# Patient Record
Sex: Female | Born: 1950 | Race: White | Hispanic: No | Marital: Married | Smoking: Former smoker
Health system: Southern US, Community
[De-identification: ages and names within clinical notes are randomized; demographics above are authoritative.]

## PROBLEM LIST (undated history)

## (undated) DIAGNOSIS — I4891 Unspecified atrial fibrillation: Secondary | ICD-10-CM

## (undated) DIAGNOSIS — C799 Secondary malignant neoplasm of unspecified site: Secondary | ICD-10-CM

## (undated) DIAGNOSIS — E039 Hypothyroidism, unspecified: Secondary | ICD-10-CM

## (undated) DIAGNOSIS — Z87891 Personal history of nicotine dependence: Secondary | ICD-10-CM

## (undated) DIAGNOSIS — C3490 Malignant neoplasm of unspecified part of unspecified bronchus or lung: Secondary | ICD-10-CM

## (undated) HISTORY — PX: BRONCHOSCOPY: SUR163

---

## 2003-10-16 ENCOUNTER — Other Ambulatory Visit: Payer: Self-pay

## 2004-11-26 ENCOUNTER — Inpatient Hospital Stay: Payer: Self-pay | Admitting: Psychiatry

## 2004-12-12 ENCOUNTER — Ambulatory Visit: Payer: Self-pay | Admitting: Family Medicine

## 2004-12-17 ENCOUNTER — Ambulatory Visit: Payer: Self-pay | Admitting: Family Medicine

## 2004-12-17 ENCOUNTER — Other Ambulatory Visit: Admission: RE | Admit: 2004-12-17 | Discharge: 2004-12-17 | Payer: Self-pay | Admitting: Family Medicine

## 2005-03-07 ENCOUNTER — Ambulatory Visit: Payer: Self-pay | Admitting: Gastroenterology

## 2005-05-23 ENCOUNTER — Ambulatory Visit: Payer: Self-pay | Admitting: Gastroenterology

## 2007-11-16 ENCOUNTER — Ambulatory Visit: Payer: Self-pay | Admitting: Family Medicine

## 2009-07-31 ENCOUNTER — Ambulatory Visit: Payer: Self-pay | Admitting: Endocrinology

## 2009-11-14 ENCOUNTER — Inpatient Hospital Stay: Payer: Self-pay | Admitting: Endocrinology

## 2011-01-20 IMAGING — CT CT HEAD WITHOUT CONTRAST
2 series · 16 of 30 positions shown, 20 images · non-contrast
Comparison: none

REASON FOR EXAM: pre-syncopal episode
COMMENTS:

[Series 2: without · axial · non-contrast · 0.42mm/px · z∈[+337,+452]mm · 13 of 35 slices shown, 17 images]
[im 3/35  brain]
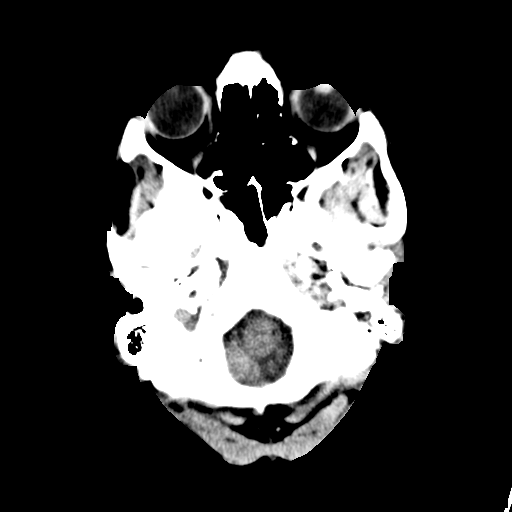
[im 3/35  bone]
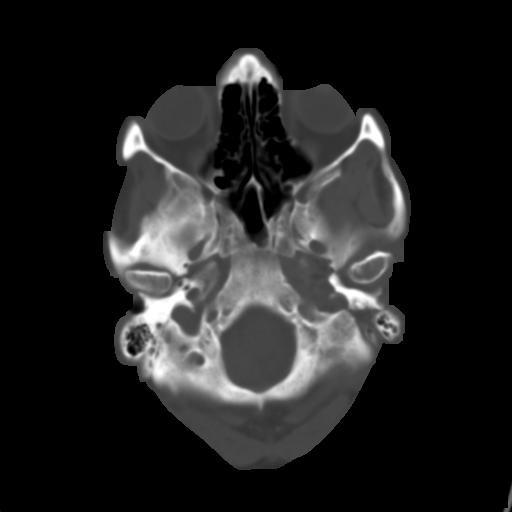
[im 5/35  brain]
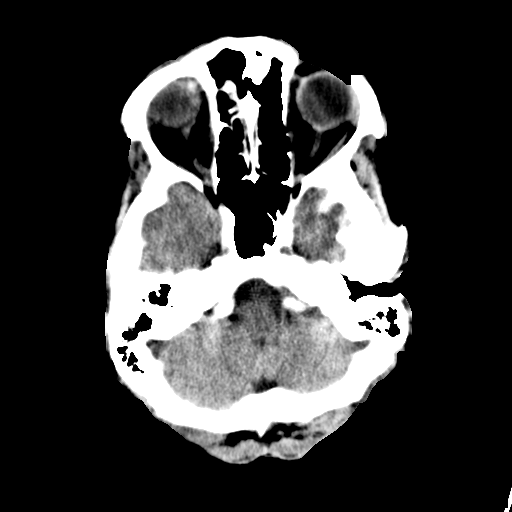
[im 8/35  brain]
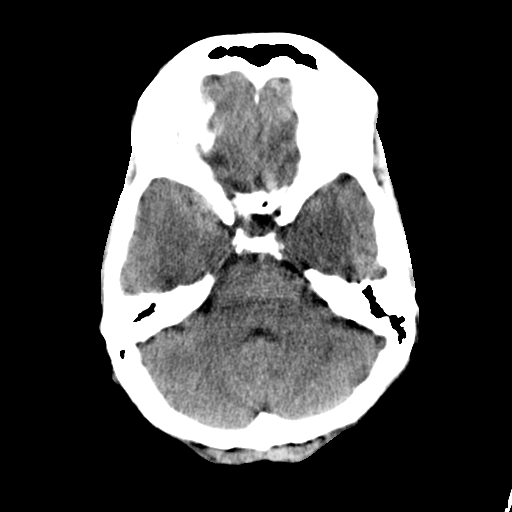
[im 10/35  brain]
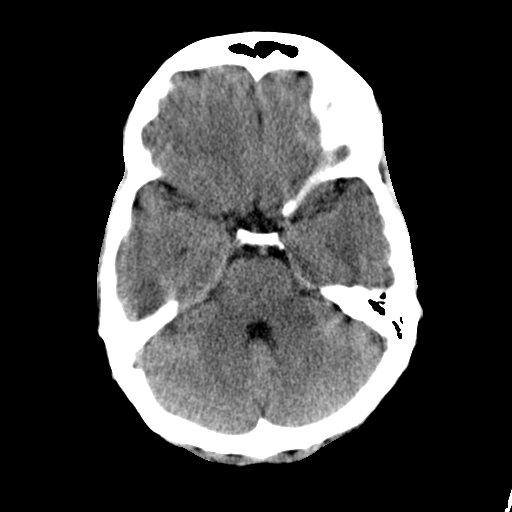
[im 13/35  brain]
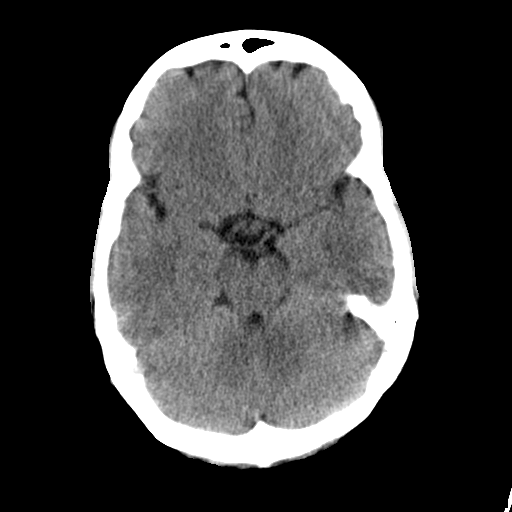
[im 13/35  bone]
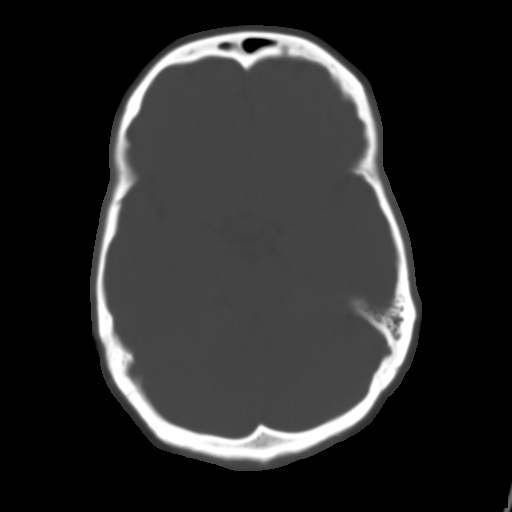
[im 15/35  brain]
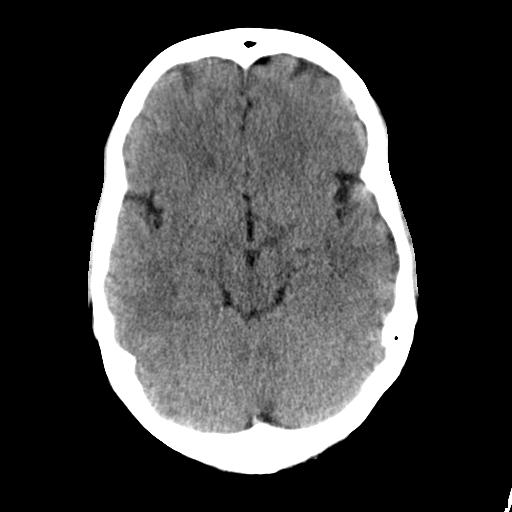
[im 18/35  brain]
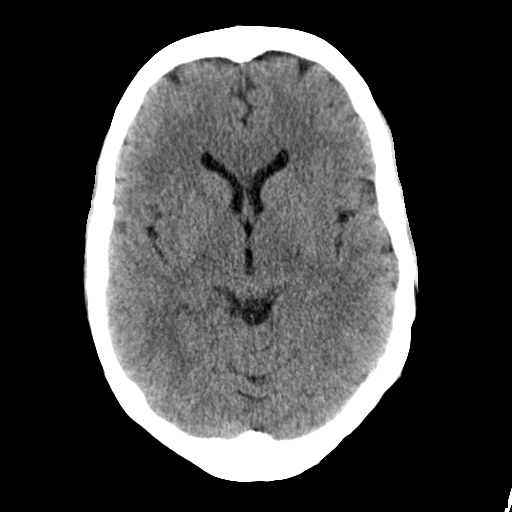
[im 20/35  brain]
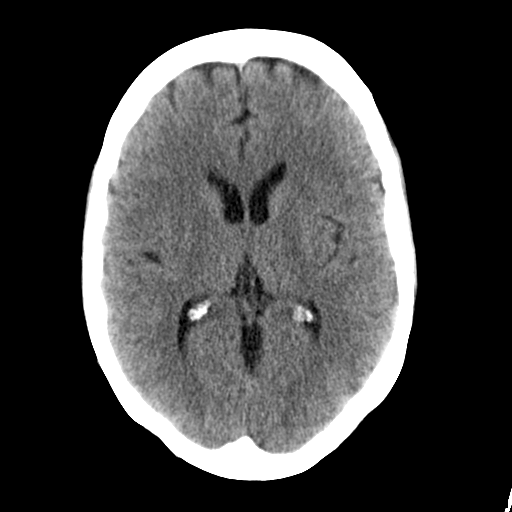
[im 22/35  brain]
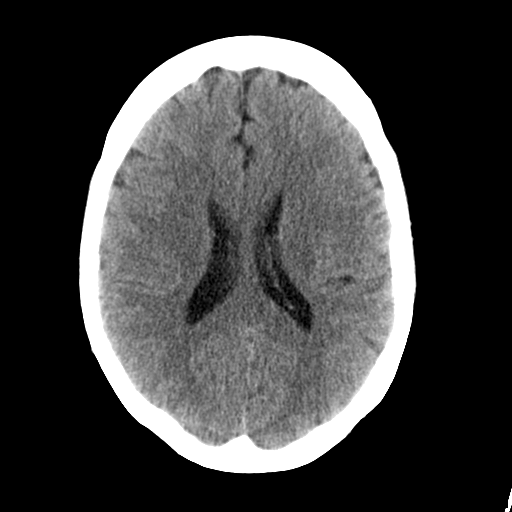
[im 22/35  bone]
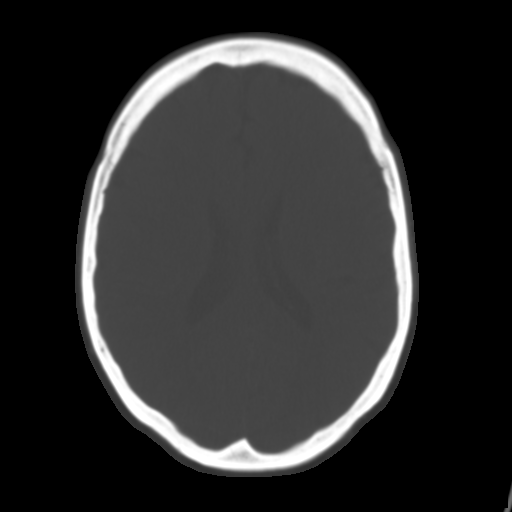
[im 25/35  brain]
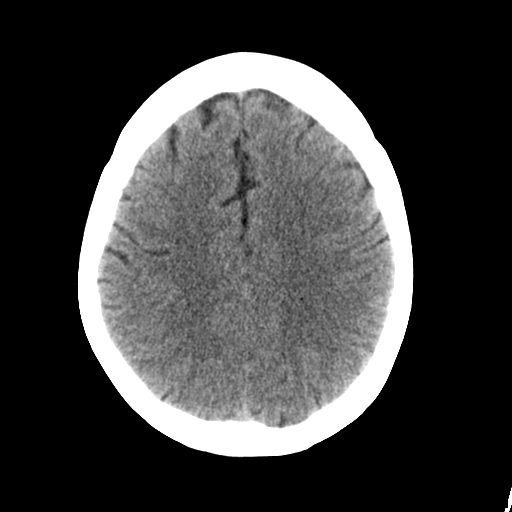
[im 27/35  brain]
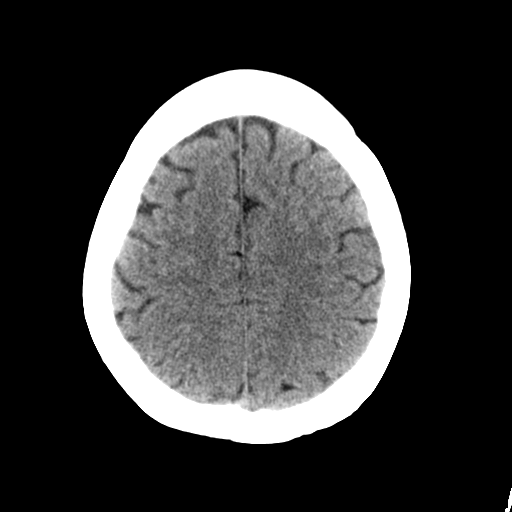
[im 30/35  brain]
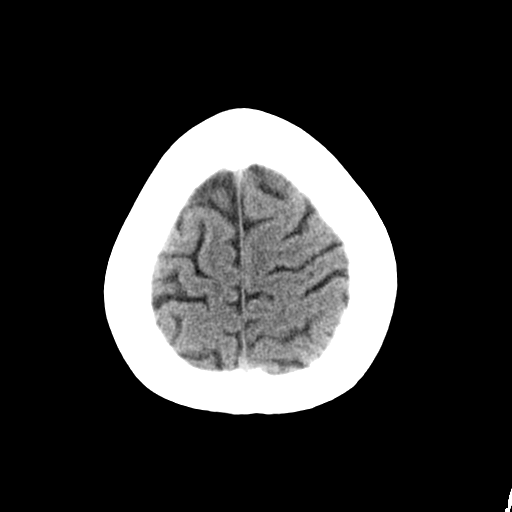
[im 32/35  brain]
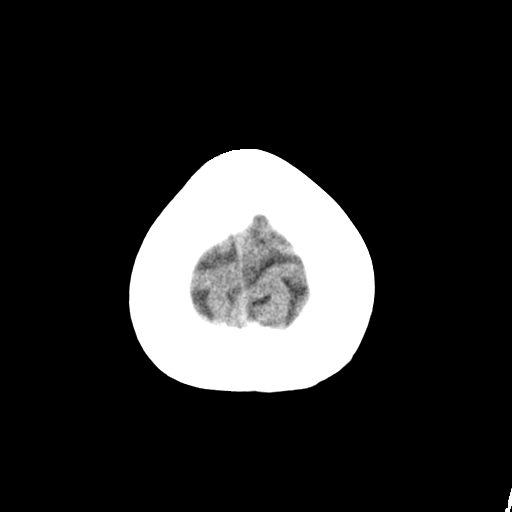
[im 32/35  bone]
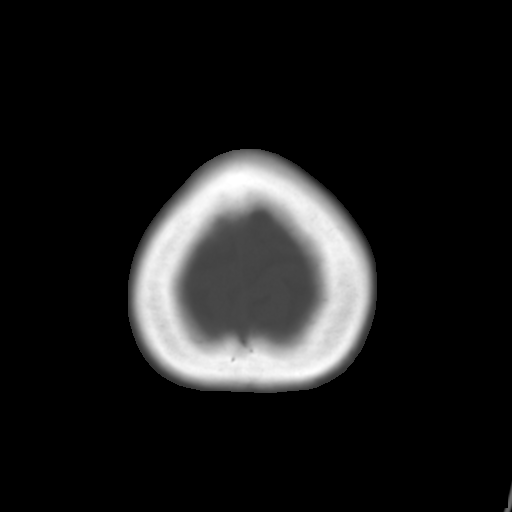

[Series 3: bone · axial · 0.42mm/px · z∈[+337,+367]mm · 3 of 35 slices shown]
[im 3/35  bone]
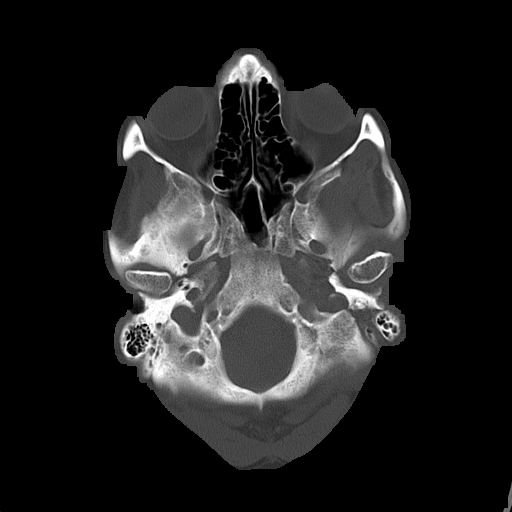
[im 8/35  bone]
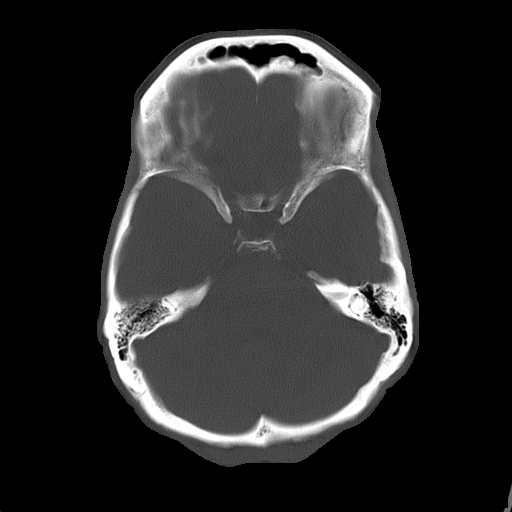
[im 13/35  bone]
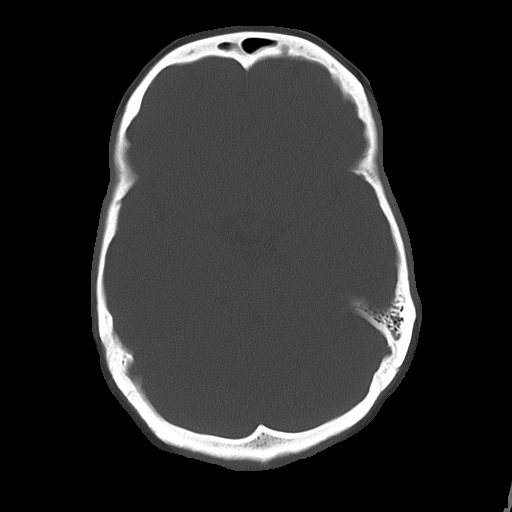

[16 of 30 positions shown; findings below may reference images not displayed]

PROCEDURE:     CT  - CT HEAD WITHOUT CONTRAST  - November 14, 2009  [DATE]

RESULT:     Noncontrast emergent CT of the brain is performed in the
standard fashion. The patient has no previous examinations for comparison.

There is a density in the left frontal sinus consistent with an osteoma
measuring approximately 8 to 8.5 mm in diameter. The sinuses are otherwise
clear. The ventricles and sulci are normal. The calvarium is intact. There
is no intracranial hemorrhage, mass, mass effect or midline shift.
IMPRESSION: 1.     No acute intracranial abnormality.
2.     Left frontal sinus osteoma.

## 2018-12-23 ENCOUNTER — Other Ambulatory Visit: Payer: Self-pay

## 2018-12-23 DIAGNOSIS — Z20822 Contact with and (suspected) exposure to covid-19: Secondary | ICD-10-CM

## 2018-12-25 LAB — NOVEL CORONAVIRUS, NAA: SARS-CoV-2, NAA: NOT DETECTED

## 2021-08-26 ENCOUNTER — Inpatient Hospital Stay
Admission: EM | Admit: 2021-08-26 | Discharge: 2021-09-07 | DRG: 951 | Disposition: E | Payer: Medicare Other | Attending: Internal Medicine | Admitting: Internal Medicine

## 2021-08-26 ENCOUNTER — Other Ambulatory Visit: Payer: Self-pay

## 2021-08-26 ENCOUNTER — Emergency Department: Payer: Medicare Other

## 2021-08-26 ENCOUNTER — Encounter: Payer: Self-pay | Admitting: Internal Medicine

## 2021-08-26 DIAGNOSIS — C7889 Secondary malignant neoplasm of other digestive organs: Secondary | ICD-10-CM | POA: Diagnosis present

## 2021-08-26 DIAGNOSIS — S72002A Fracture of unspecified part of neck of left femur, initial encounter for closed fracture: Secondary | ICD-10-CM | POA: Diagnosis present

## 2021-08-26 DIAGNOSIS — I480 Paroxysmal atrial fibrillation: Secondary | ICD-10-CM | POA: Diagnosis present

## 2021-08-26 DIAGNOSIS — R64 Cachexia: Secondary | ICD-10-CM | POA: Diagnosis present

## 2021-08-26 DIAGNOSIS — T7431XA Adult psychological abuse, confirmed, initial encounter: Secondary | ICD-10-CM | POA: Diagnosis present

## 2021-08-26 DIAGNOSIS — G893 Neoplasm related pain (acute) (chronic): Secondary | ICD-10-CM | POA: Diagnosis present

## 2021-08-26 DIAGNOSIS — C7931 Secondary malignant neoplasm of brain: Secondary | ICD-10-CM | POA: Diagnosis present

## 2021-08-26 DIAGNOSIS — Z681 Body mass index (BMI) 19 or less, adult: Secondary | ICD-10-CM

## 2021-08-26 DIAGNOSIS — F319 Bipolar disorder, unspecified: Secondary | ICD-10-CM | POA: Diagnosis present

## 2021-08-26 DIAGNOSIS — T7411XA Adult physical abuse, confirmed, initial encounter: Secondary | ICD-10-CM | POA: Diagnosis present

## 2021-08-26 DIAGNOSIS — M797 Fibromyalgia: Secondary | ICD-10-CM | POA: Diagnosis present

## 2021-08-26 DIAGNOSIS — Z66 Do not resuscitate: Secondary | ICD-10-CM | POA: Diagnosis present

## 2021-08-26 DIAGNOSIS — E785 Hyperlipidemia, unspecified: Secondary | ICD-10-CM | POA: Diagnosis present

## 2021-08-26 DIAGNOSIS — E43 Unspecified severe protein-calorie malnutrition: Secondary | ICD-10-CM | POA: Diagnosis present

## 2021-08-26 DIAGNOSIS — Z515 Encounter for palliative care: Secondary | ICD-10-CM | POA: Diagnosis not present

## 2021-08-26 DIAGNOSIS — F419 Anxiety disorder, unspecified: Secondary | ICD-10-CM | POA: Diagnosis present

## 2021-08-26 DIAGNOSIS — C3490 Malignant neoplasm of unspecified part of unspecified bronchus or lung: Secondary | ICD-10-CM | POA: Diagnosis present

## 2021-08-26 DIAGNOSIS — R531 Weakness: Secondary | ICD-10-CM

## 2021-08-26 DIAGNOSIS — Z9221 Personal history of antineoplastic chemotherapy: Secondary | ICD-10-CM

## 2021-08-26 DIAGNOSIS — R627 Adult failure to thrive: Secondary | ICD-10-CM | POA: Diagnosis present

## 2021-08-26 DIAGNOSIS — Z20822 Contact with and (suspected) exposure to covid-19: Secondary | ICD-10-CM | POA: Diagnosis present

## 2021-08-26 DIAGNOSIS — R54 Age-related physical debility: Secondary | ICD-10-CM | POA: Diagnosis present

## 2021-08-26 DIAGNOSIS — J189 Pneumonia, unspecified organism: Secondary | ICD-10-CM | POA: Diagnosis present

## 2021-08-26 DIAGNOSIS — R6520 Severe sepsis without septic shock: Secondary | ICD-10-CM | POA: Diagnosis present

## 2021-08-26 DIAGNOSIS — W19XXXA Unspecified fall, initial encounter: Secondary | ICD-10-CM | POA: Diagnosis present

## 2021-08-26 DIAGNOSIS — E039 Hypothyroidism, unspecified: Secondary | ICD-10-CM | POA: Diagnosis present

## 2021-08-26 DIAGNOSIS — F418 Other specified anxiety disorders: Secondary | ICD-10-CM | POA: Diagnosis present

## 2021-08-26 DIAGNOSIS — R0902 Hypoxemia: Secondary | ICD-10-CM | POA: Diagnosis present

## 2021-08-26 DIAGNOSIS — A419 Sepsis, unspecified organism: Secondary | ICD-10-CM | POA: Diagnosis present

## 2021-08-26 DIAGNOSIS — R9431 Abnormal electrocardiogram [ECG] [EKG]: Secondary | ICD-10-CM

## 2021-08-26 DIAGNOSIS — T7491XA Unspecified adult maltreatment, confirmed, initial encounter: Secondary | ICD-10-CM | POA: Diagnosis present

## 2021-08-26 DIAGNOSIS — Z803 Family history of malignant neoplasm of breast: Secondary | ICD-10-CM

## 2021-08-26 DIAGNOSIS — E876 Hypokalemia: Secondary | ICD-10-CM | POA: Diagnosis present

## 2021-08-26 DIAGNOSIS — Z87891 Personal history of nicotine dependence: Secondary | ICD-10-CM

## 2021-08-26 HISTORY — DX: Personal history of nicotine dependence: Z87.891

## 2021-08-26 HISTORY — DX: Malignant neoplasm of unspecified part of unspecified bronchus or lung: C34.90

## 2021-08-26 HISTORY — DX: Unspecified atrial fibrillation: I48.91

## 2021-08-26 HISTORY — DX: Secondary malignant neoplasm of unspecified site: C79.9

## 2021-08-26 HISTORY — DX: Hypothyroidism, unspecified: E03.9

## 2021-08-26 LAB — CBC WITH DIFFERENTIAL/PLATELET
Abs Immature Granulocytes: 0.47 10*3/uL — ABNORMAL HIGH (ref 0.00–0.07)
Basophils Absolute: 0.1 10*3/uL (ref 0.0–0.1)
Basophils Relative: 0 %
Eosinophils Absolute: 0 10*3/uL (ref 0.0–0.5)
Eosinophils Relative: 0 %
HCT: 28 % — ABNORMAL LOW (ref 36.0–46.0)
Hemoglobin: 8.3 g/dL — ABNORMAL LOW (ref 12.0–15.0)
Immature Granulocytes: 2 %
Lymphocytes Relative: 3 %
Lymphs Abs: 0.7 10*3/uL (ref 0.7–4.0)
MCH: 25.9 pg — ABNORMAL LOW (ref 26.0–34.0)
MCHC: 29.6 g/dL — ABNORMAL LOW (ref 30.0–36.0)
MCV: 87.5 fL (ref 80.0–100.0)
Monocytes Absolute: 1.4 10*3/uL — ABNORMAL HIGH (ref 0.1–1.0)
Monocytes Relative: 6 %
Neutro Abs: 22.6 10*3/uL — ABNORMAL HIGH (ref 1.7–7.7)
Neutrophils Relative %: 89 %
Platelets: 339 10*3/uL (ref 150–400)
RBC: 3.2 MIL/uL — ABNORMAL LOW (ref 3.87–5.11)
RDW: 16.7 % — ABNORMAL HIGH (ref 11.5–15.5)
Smear Review: NORMAL
WBC: 25.2 10*3/uL — ABNORMAL HIGH (ref 4.0–10.5)
nRBC: 0 % (ref 0.0–0.2)

## 2021-08-26 LAB — PROTIME-INR
INR: 1.1 (ref 0.8–1.2)
Prothrombin Time: 14.2 seconds (ref 11.4–15.2)

## 2021-08-26 LAB — COMPREHENSIVE METABOLIC PANEL
ALT: 28 U/L (ref 0–44)
AST: 34 U/L (ref 15–41)
Albumin: 3.1 g/dL — ABNORMAL LOW (ref 3.5–5.0)
Alkaline Phosphatase: 179 U/L — ABNORMAL HIGH (ref 38–126)
Anion gap: 13 (ref 5–15)
BUN: 12 mg/dL (ref 8–23)
CO2: 25 mmol/L (ref 22–32)
Calcium: 8.9 mg/dL (ref 8.9–10.3)
Chloride: 98 mmol/L (ref 98–111)
Creatinine, Ser: 0.47 mg/dL (ref 0.44–1.00)
GFR, Estimated: >60 mL/min (ref >60)
Glucose, Bld: 128 mg/dL — ABNORMAL HIGH (ref 70–99)
Potassium: 3.3 mmol/L — ABNORMAL LOW (ref 3.5–5.1)
Sodium: 136 mmol/L (ref 135–145)
Total Bilirubin: 0.7 mg/dL (ref 0.3–1.2)
Total Protein: 7.2 g/dL (ref 6.5–8.1)

## 2021-08-26 LAB — RESP PANEL BY RT-PCR (FLU A&B, COVID) ARPGX2
Influenza A by PCR: NEGATIVE
Influenza B by PCR: NEGATIVE
SARS Coronavirus 2 by RT PCR: NEGATIVE

## 2021-08-26 LAB — MAGNESIUM: Magnesium: 2 mg/dL (ref 1.7–2.4)

## 2021-08-26 LAB — TSH: TSH: 28.005 u[IU]/mL — ABNORMAL HIGH (ref 0.350–4.500)

## 2021-08-26 LAB — LACTIC ACID, PLASMA: Lactic Acid, Venous: 2.3 mmol/L (ref 0.5–1.9)

## 2021-08-26 MED ORDER — LACTATED RINGERS IV BOLUS (SEPSIS): 500.0000 mL | Freq: Once | INTRAVENOUS | Status: DC

## 2021-08-26 MED ORDER — LORAZEPAM 2 MG/ML IJ SOLN: 1.0000 mg | INTRAMUSCULAR | Status: DC | PRN

## 2021-08-26 MED ORDER — HEPARIN SODIUM (PORCINE) 5000 UNIT/ML IJ SOLN: 5000.0000 [IU] | Freq: Three times a day (TID) | INTRAMUSCULAR | Status: DC

## 2021-08-26 MED ORDER — ONDANSETRON 4 MG PO TBDP: 4.0000 mg | ORAL_TABLET | Freq: Four times a day (QID) | ORAL | Status: DC | PRN

## 2021-08-26 MED ORDER — CEFEPIME HCL 2 G IV SOLR
2.0000 g | Freq: Once | INTRAVENOUS | Status: AC
  Administered 2021-08-26: 2 g via INTRAVENOUS
  Filled 2021-08-26: qty 12.5

## 2021-08-26 MED ORDER — LORAZEPAM 2 MG/ML IJ SOLN
1.0000 mg | INTRAMUSCULAR | Status: DC | PRN
  Administered 2021-08-27 (×2): 1 mg via INTRAVENOUS
  Filled 2021-08-26 (×2): qty 1

## 2021-08-26 MED ORDER — POLYVINYL ALCOHOL 1.4 % OP SOLN: 1.0000 [drp] | Freq: Four times a day (QID) | OPHTHALMIC | Status: DC | PRN

## 2021-08-26 MED ORDER — IPRATROPIUM-ALBUTEROL 0.5-2.5 (3) MG/3ML IN SOLN
3.0000 mL | Freq: Four times a day (QID) | RESPIRATORY_TRACT | Status: DC | PRN
  Filled 2021-08-26: qty 3

## 2021-08-26 MED ORDER — LOPERAMIDE HCL 2 MG PO CAPS: 2.0000 mg | ORAL_CAPSULE | ORAL | Status: DC | PRN

## 2021-08-26 MED ORDER — ACETAMINOPHEN 325 MG PO TABS: 650.0000 mg | ORAL_TABLET | Freq: Four times a day (QID) | ORAL | Status: DC | PRN

## 2021-08-26 MED ORDER — THYROID 60 MG PO TABS
90.0000 mg | ORAL_TABLET | ORAL | Status: DC
  Filled 2021-08-26: qty 1

## 2021-08-26 MED ORDER — HALOPERIDOL LACTATE 2 MG/ML PO CONC: 0.5000 mg | ORAL | Status: DC | PRN

## 2021-08-26 MED ORDER — TRAZODONE HCL 50 MG PO TABS: 25.0000 mg | ORAL_TABLET | Freq: Every evening | ORAL | Status: DC | PRN

## 2021-08-26 MED ORDER — FLUTICASONE FUROATE-VILANTEROL 200-25 MCG/ACT IN AEPB: 1.0000 | INHALATION_SPRAY | Freq: Every day | RESPIRATORY_TRACT | Status: DC

## 2021-08-26 MED ORDER — GLYCOPYRROLATE 1 MG PO TABS: 1.0000 mg | ORAL_TABLET | ORAL | Status: DC | PRN

## 2021-08-26 MED ORDER — BIOTENE DRY MOUTH MT LIQD: 15.0000 mL | OROMUCOSAL | Status: DC | PRN

## 2021-08-26 MED ORDER — MORPHINE 100MG IN NS 100ML (1MG/ML) PREMIX INFUSION
1.0000 mg/h | INTRAVENOUS | Status: DC
  Administered 2021-08-26: 1 mg/h via INTRAVENOUS
  Filled 2021-08-26 (×2): qty 100

## 2021-08-26 MED ORDER — ONDANSETRON HCL 4 MG/2ML IJ SOLN: 4.0000 mg | Freq: Four times a day (QID) | INTRAMUSCULAR | Status: DC | PRN

## 2021-08-26 MED ORDER — HALOPERIDOL 0.5 MG PO TABS: 0.5000 mg | ORAL_TABLET | ORAL | Status: DC | PRN

## 2021-08-26 MED ORDER — MAGNESIUM SULFATE IN D5W 1-5 GM/100ML-% IV SOLN
1.0000 g | Freq: Once | INTRAVENOUS | Status: AC
  Administered 2021-08-26: 1 g via INTRAVENOUS
  Filled 2021-08-26: qty 100

## 2021-08-26 MED ORDER — GLYCOPYRROLATE 0.2 MG/ML IJ SOLN: 0.2000 mg | INTRAMUSCULAR | Status: DC | PRN

## 2021-08-26 MED ORDER — FLUOXETINE HCL 20 MG PO CAPS
20.0000 mg | ORAL_CAPSULE | Freq: Every day | ORAL | Status: DC
  Administered 2021-08-26: 20 mg via ORAL
  Filled 2021-08-26 (×2): qty 1

## 2021-08-26 MED ORDER — VANCOMYCIN HCL IN DEXTROSE 1-5 GM/200ML-% IV SOLN
1000.0000 mg | Freq: Once | INTRAVENOUS | Status: DC
  Administered 2021-08-26: 1000 mg via INTRAVENOUS
  Filled 2021-08-26: qty 200

## 2021-08-26 MED ORDER — SENNOSIDES-DOCUSATE SODIUM 8.6-50 MG PO TABS: 1.0000 | ORAL_TABLET | Freq: Every evening | ORAL | Status: DC | PRN

## 2021-08-26 MED ORDER — ONDANSETRON HCL 4 MG/2ML IJ SOLN
4.0000 mg | Freq: Four times a day (QID) | INTRAMUSCULAR | Status: DC | PRN
  Administered 2021-08-27: 4 mg via INTRAVENOUS
  Filled 2021-08-26: qty 2

## 2021-08-26 MED ORDER — ACETAMINOPHEN 650 MG RE SUPP
650.0000 mg | Freq: Four times a day (QID) | RECTAL | Status: DC | PRN
  Administered 2021-08-27: 650 mg via RECTAL
  Filled 2021-08-26: qty 1

## 2021-08-26 MED ORDER — SODIUM CHLORIDE 0.9 % IV SOLN
12.5000 mg | Freq: Four times a day (QID) | INTRAVENOUS | Status: DC | PRN
  Filled 2021-08-26: qty 0.5

## 2021-08-26 MED ORDER — ALUM & MAG HYDROXIDE-SIMETH 200-200-20 MG/5ML PO SUSP: 30.0000 mL | Freq: Four times a day (QID) | ORAL | Status: DC | PRN

## 2021-08-26 MED ORDER — HALOPERIDOL LACTATE 5 MG/ML IJ SOLN: 0.5000 mg | INTRAMUSCULAR | Status: DC | PRN

## 2021-08-26 MED ORDER — VORTIOXETINE HBR 5 MG PO TABS
5.0000 mg | ORAL_TABLET | Freq: Every morning | ORAL | Status: DC
  Administered 2021-08-26: 5 mg via ORAL
  Filled 2021-08-26 (×2): qty 1

## 2021-08-26 MED ORDER — ACETAMINOPHEN 325 MG RE SUPP: 650.0000 mg | Freq: Four times a day (QID) | RECTAL | Status: DC | PRN

## 2021-08-26 MED ORDER — LORAZEPAM 2 MG/ML PO CONC: 1.0000 mg | ORAL | Status: DC | PRN

## 2021-08-26 MED ORDER — OCTREOTIDE ACETATE 100 MCG/ML IJ SOLN: 100.0000 ug | Freq: Three times a day (TID) | INTRAMUSCULAR | Status: DC | PRN

## 2021-08-26 MED ORDER — THYROID 60 MG PO TABS: 90.0000 mg | ORAL_TABLET | Freq: Every day | ORAL | Status: DC

## 2021-08-26 MED ORDER — MORPHINE SULFATE (CONCENTRATE) 10 MG/0.5ML PO SOLN
5.0000 mg | ORAL | Status: DC | PRN
  Administered 2021-08-26 – 2021-08-27 (×2): 5 mg via SUBLINGUAL
  Filled 2021-08-26 (×2): qty 0.5

## 2021-08-26 MED ORDER — LACTATED RINGERS IV BOLUS (SEPSIS)
1000.0000 mL | Freq: Once | INTRAVENOUS | Status: AC
  Administered 2021-08-26: 1000 mL via INTRAVENOUS

## 2021-08-26 MED ORDER — ONDANSETRON HCL 4 MG PO TABS: 4.0000 mg | ORAL_TABLET | Freq: Four times a day (QID) | ORAL | Status: DC | PRN

## 2021-08-26 MED ORDER — LORAZEPAM 1 MG PO TABS: 1.0000 mg | ORAL_TABLET | ORAL | Status: DC | PRN

## 2021-08-26 MED ORDER — OLANZAPINE 2.5 MG PO TABS
2.5000 mg | ORAL_TABLET | Freq: Every day | ORAL | Status: DC
  Administered 2021-08-26: 2.5 mg via ORAL
  Filled 2021-08-26: qty 1
  Filled 2021-08-26: qty 0.5

## 2021-08-26 MED ORDER — MIRTAZAPINE 15 MG PO TABS
7.5000 mg | ORAL_TABLET | Freq: Every evening | ORAL | Status: DC
  Administered 2021-08-26: 7.5 mg via ORAL
  Filled 2021-08-26: qty 1

## 2021-08-26 MED ORDER — MORPHINE BOLUS VIA INFUSION
1.0000 mg | INTRAVENOUS | Status: DC | PRN
  Administered 2021-08-27 – 2021-08-28 (×6): 1 mg via INTRAVENOUS

## 2021-08-26 MED ORDER — MORPHINE SULFATE (CONCENTRATE) 10 MG/0.5ML PO SOLN: 5.0000 mg | ORAL | Status: DC | PRN

## 2021-08-26 MED ORDER — MORPHINE 100MG IN NS 100ML (1MG/ML) PREMIX INFUSION
1.0000 mg/h | INTRAVENOUS | Status: DC
  Filled 2021-08-26: qty 100

## 2021-08-26 MED ORDER — OXYBUTYNIN CHLORIDE 5 MG PO TABS: 2.5000 mg | ORAL_TABLET | Freq: Four times a day (QID) | ORAL | Status: DC | PRN

## 2021-08-26 MED ORDER — UMECLIDINIUM BROMIDE 62.5 MCG/ACT IN AEPB: 1.0000 | INHALATION_SPRAY | Freq: Every day | RESPIRATORY_TRACT | Status: DC

## 2021-08-26 NOTE — Assessment & Plan Note (Signed)
-   APS has been alerted by nursing staff - This is a confidential chart

## 2021-08-26 NOTE — ED Provider Triage Note (Signed)
Emergency Medicine Provider Triage Evaluation Note  Allison Norris , a 71 y.o. female  was evaluated in triage.  Pt complains of failure to thrive, dehydration, mental health.  Patient recently finished cancer treatments.  Husband states she is not willing to eat or drink.  Become very weak.  He thinks she needs mental health placement..  Review of Systems  Positive: See above Negative: Fever  Physical Exam  BP 99/63 (BP Location: Right Arm)   Pulse 100   Temp 98.7 F (37.1 C) (Oral)   Resp 18   Wt 46.3 kg   SpO2 98%  Gen:   Awake, no distress   Resp:  Normal effort  MSK:   Moves extremities without difficulty  Other:    Medical Decision Making  Medically screening exam initiated at 4:05 PM.  Appropriate orders placed.  Allison Norris was informed that the remainder of the evaluation will be completed by another provider, this initial triage assessment does not replace that evaluation, and the importance of remaining in the ED until their evaluation is complete.  Feel that patient needs to be medically cleared prior to considering psych admission.  Nursing staff aware   Versie Starks, PA-C 09/04/2021 1606

## 2021-08-26 NOTE — Assessment & Plan Note (Addendum)
-   Armour Thyroid 90 mg every other day with breakfast, resumed

## 2021-08-26 NOTE — Assessment & Plan Note (Addendum)
-   With brain and pancreas metastasis

## 2021-08-26 NOTE — Assessment & Plan Note (Addendum)
-   As needed pain medication have been ordered
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
## 2021-08-26 NOTE — Progress Notes (Signed)
6/19/20223 at 2130:  Pt declined chart password set up at this time.
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
## 2021-08-26 NOTE — Assessment & Plan Note (Addendum)
-   Patient presenting with apparent failure to thrive and meeting severe sepsis criteria, etiology was not worked up due to patient's request - After discussion in the emergency medicine provider and with the hospitalist provider, patient has elected for comfort measures only - Comfort care order set utilized - No antibiotics or IVF at patient's request. - As needed medications include pain control with morphine as needed, would transition to a drip if needed but patient does not appear to be in pain at this time - Admit to palliative care - Comfort measures have been utilized - Chaplain/spiritual care has been consulted at patient's request for prior - RN can pronounce death
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
## 2021-08-26 NOTE — Assessment & Plan Note (Signed)
-   Patient had rosuvastatin 20 mg nightly

## 2021-08-26 NOTE — Consult Note (Signed)
PHARMACY -  BRIEF ANTIBIOTIC NOTE   Pharmacy has received consult(s) for Vancomycin and Cefepime from an ED provider.  The patient's profile has been reviewed for ht/wt/allergies/indication/available labs.    One time order(s) placed for Vancomycin 1g IV and Cefepime 2g IV x 1 dose each.  Further antibiotics/pharmacy consults should be ordered by admitting physician if indicated.                       Thank you, Pearla Dubonnet 09/03/2021  5:23 PM
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
## 2021-08-26 NOTE — ED Notes (Addendum)
Per pt, it is okay for daughter(Angela Meda Coffee) to be updated and called.

## 2021-08-26 NOTE — Assessment & Plan Note (Addendum)
-   Patient had clonazepam 1 mg by mouth twice daily as needed for anxiety - Mirtazapine 15 mg tablet, half tablet nightly - Trazodone 100 mg nightly as needed

## 2021-08-26 NOTE — Sepsis Progress Note (Signed)
Sepsis protocol followed by eLink 

## 2021-08-26 NOTE — Assessment & Plan Note (Signed)
-  Patient met sepsis criteria with acute hypoxia requiring 2 L nasal cannula, WBC elevated, increased respiration rate - Differentials include severe sepsis secondary to bacteremia, pneumonia - Patient is status post cefepime, LR 1 L bolus, magnesium 1 g IV, vancomycin 1 g IV per EDP - No further antibiotic or fluid bolus have been ordered at this time in setting of lower lobe pleural effusion, respiratory distress and patient has elected to receive comfort measures only

## 2021-08-26 NOTE — ED Notes (Signed)
Pt had prolonged QTC on EKG, line placed and BC and LA and CBC and CMP and blue top sent to lab

## 2021-08-26 NOTE — Hospital Course (Addendum)
Ms. 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  In triage, patient SPO2 was 86% on room air, placed was placed on 2 L nasal cannula with improvement.  Initial vitals showed temperature 98.7, respiration rate of 18, heart rate of 100, blood pressure 99/63 and improved to 107/73, current SPO2 of 93% on room air.  Serum sodium is 136, potassium 3.3, chloride of 98, bicarb 25, BUN of 12, serum creatinine 0.47, GFR greater than 60, nonfasting blood glucose 128, WBC elevated at 25.2, hemoglobin 8.3, platelets of 339.  Magnesium level was 2.0.  Lactic acid was elevated at 2.3.  Alk phos was 179.  ED treatment: Cefepime 2 g IV, magnesium 1 g IV, vancomycin 1 g IV, LR 1 L bolus.  Additional liter bolus were ordered initially.

## 2021-08-26 NOTE — Assessment & Plan Note (Signed)
-   This chart has been made confidential - Patient adamantly states that she does not wish for her spouse or her daughter April to be allowed access to her or her chart during her hospitalization - She adamantly states that she does not wish for her spouse to have decision-making regarding her care any longer

## 2021-08-26 NOTE — Consult Note (Addendum)
CODE SEPSIS - PHARMACY COMMUNICATION  **Broad Spectrum Antibiotics should be administered within 1 hour of Sepsis diagnosis**  Time Code Sepsis Called/Page Received: 1721  Antibiotics Ordered: Cefepime, Vanc  Time of 1st antibiotic administration: 1748  Additional action taken by pharmacy: none  If necessary, Name of Provider/Nurse Contacted: n/a    Pearla Dubonnet ,PharmD Clinical Pharmacist  08/29/2021  5:22 PM

## 2021-08-26 NOTE — Assessment & Plan Note (Addendum)
-   Indeterminate age, present on admission

## 2021-08-26 NOTE — Assessment & Plan Note (Addendum)
-   Found to have RVR presumed secondary to pericardial effusion on 07/04/2021 - Her CHA2DS2-VASc score was 3 however given patient's risk of bleeding in setting of intracranial mets, anticoagulation was deferred - She had a complete echo 07/05/2021 which showed mild MR, moderate TR, mild PAH, moderate pericardial effusion with no evidence of tamponade  -Patient was prescribed amiodarone 400 mg total by mouth 3 times daily with meals for 3 days and then transition to 200 mg daily and that patient was to start taking on 07/12/2021

## 2021-08-27 DIAGNOSIS — S72002A Fracture of unspecified part of neck of left femur, initial encounter for closed fracture: Secondary | ICD-10-CM | POA: Diagnosis not present

## 2021-08-27 DIAGNOSIS — C3490 Malignant neoplasm of unspecified part of unspecified bronchus or lung: Secondary | ICD-10-CM | POA: Diagnosis not present

## 2021-08-27 DIAGNOSIS — R627 Adult failure to thrive: Secondary | ICD-10-CM

## 2021-08-27 DIAGNOSIS — Z515 Encounter for palliative care: Secondary | ICD-10-CM | POA: Diagnosis not present

## 2021-08-27 LAB — BLOOD CULTURE ID PANEL (REFLEXED) - BCID2

## 2021-08-27 MED ORDER — KCL-LACTATED RINGERS 20 MEQ/L IV SOLN
INTRAVENOUS | Status: DC
Start: 2021-08-27 — End: 2021-08-27

## 2021-08-27 MED ORDER — MORPHINE 100MG IN NS 100ML (1MG/ML) PREMIX INFUSION
1.0000 mg/h | INTRAVENOUS | Status: DC
  Administered 2021-08-27: 1 mg/h via INTRAVENOUS
  Filled 2021-08-27: qty 100

## 2021-08-27 MED ORDER — POTASSIUM CHLORIDE 2 MEQ/ML IV SOLN
INTRAVENOUS | Status: DC
  Filled 2021-08-27: qty 1000
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
## 2021-08-27 NOTE — Progress Notes (Signed)
  Chaplain On-Call responded to [place] Order from Amy Cox, DO, regarding end of life condition of the patient.  Chaplain received medical update from patient's RN Tiffany. Chaplain attempted to visit with the patient, but was not able to awaken the patient, who was sleeping soundly and did not respond to my voice.  Will refer to the Afternoon Chaplain for a later attempt to visit.  Chaplain Pollyann Samples M.Div., [facility]
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
## 2021-08-27 NOTE — Progress Notes (Signed)
   08/27/21 1600  Clinical Encounter Type  Visited With Health care provider  Visit Type Initial  Referral From Physician  Consult/Referral To Chaplain   Chaplain responded to nurse consult. Chaplain provided a silent prayer as patient was sleeping. Chaplain will have next on call Chaplain follow up.

## 2021-08-27 NOTE — Progress Notes (Signed)
Morphine drip infusing per order. Patient medicated for anxiety with prn ativan. Will continue to monitor and adjust for comfort.

## 2021-08-27 NOTE — Consult Note (Signed)
[facility]  [phone] [phone]  ID: Cleone Slim OB: 1951-01-23  MR#: [id number]  WVP#:[id number]  Patient Care Team: Pcp, No as PCP - General  CHIEF COMPLAINT: Stage IV small cell lung carcinoma.  INTERVAL HISTORY: Patient is a 71 year old female who recently received treatment for her stage IV small cell carcinoma [facility], but discontinued treatment earlier this year and in April it was recommended that she enroll in hospice/comfort care.  Patient was alert and oriented upon admission, but today she is lethargic and unresponsive.  Unable to obtain review of systems.  REVIEW OF SYSTEMS:   Review of Systems  Unable to perform ROS: Medical condition    PAST MEDICAL HISTORY: Past Medical History:  Diagnosis Date   Atrial fibrillation ([place])    History of tobacco use    Hypothyroid    Metastasis ([place])    to brain and pancreas   Metastatic non-small cell lung cancer ([place])     PAST SURGICAL HISTORY: Past Surgical History:  Procedure Laterality Date   BRONCHOSCOPY     with EBUS on 04/03/2020    FAMILY HISTORY: Family History  Problem Relation Age of Onset   Breast cancer Sister     ADVANCED DIRECTIVES (Y/N):  @ADVDIR @  HEALTH MAINTENANCE: Social History   Tobacco Use   Smoking status: Former    Packs/day: 2.00    Types: Cigarettes    Quit date: 2021    Years since quitting: 2.4  Substance Use Topics   Alcohol use: Not Currently   Drug use: Not Currently     Colonoscopy:  PAP:  Bone density:  Lipid panel:  No Known Allergies  Current Facility-Administered Medications  Medication Dose Route Frequency Provider Last Rate Last Admin   acetaminophen (TYLENOL) tablet 650 mg  650 mg Oral Q6H PRN Cox, Amy N, DO       Or   acetaminophen (TYLENOL) suppository 650 mg  650 mg Rectal Q6H PRN Cox, Amy N, DO   650 mg at 08/27/21 1516   alum & mag hydroxide-simeth (MAALOX/MYLANTA) 200-200-20 MG/5ML suspension 30 mL  30 mL Oral  Q6H PRN Cox, Amy N, DO       antiseptic oral rinse (BIOTENE) solution 15 mL  15 mL Topical PRN Cox, Amy N, DO       chlorproMAZINE (THORAZINE) 12.5 mg in sodium chloride 0.9 % 25 mL IVPB  12.5 mg Intravenous Q6H PRN Cox, Amy N, DO       FLUoxetine (PROZAC) capsule 20 mg  20 mg Oral Daily Cox, Amy N, DO   20 mg at 08/20/2021 2227   fluticasone furoate-vilanterol (BREO ELLIPTA) 200-25 MCG/ACT 1 puff  1 puff Inhalation Daily Cox, Amy N, DO       And   umeclidinium bromide (INCRUSE ELLIPTA) 62.5 MCG/ACT 1 puff  1 puff Inhalation Daily Cox, Amy N, DO       glycopyrrolate (ROBINUL) tablet 1 mg  1 mg Oral Q4H PRN Cox, Amy N, DO       Or   glycopyrrolate (ROBINUL) injection 0.2 mg  0.2 mg Subcutaneous Q4H PRN Cox, Amy N, DO       Or   glycopyrrolate (ROBINUL) injection 0.2 mg  0.2 mg Intravenous Q4H PRN Cox, Amy N, DO       haloperidol (HALDOL) tablet 0.5 mg  0.5 mg Oral Q4H PRN Cox, Amy N, DO       Or   haloperidol (HALDOL) 2 MG/ML solution 0.5 mg  0.5 mg Sublingual Q4H PRN Cox, Amy N, DO       Or   haloperidol lactate (HALDOL) injection 0.5 mg  0.5 mg Intravenous Q4H PRN Cox, Amy N, DO       ipratropium-albuterol (DUONEB) 0.5-2.5 (3) MG/3ML nebulizer solution 3 mL  3 mL Nebulization Q6H PRN Cox, Amy N, DO       loperamide (IMODIUM) capsule 2 mg  2 mg Oral Q2H PRN Cox, Amy N, DO       LORazepam (ATIVAN) tablet 1 mg  1 mg Oral Q4H PRN Cox, Amy N, DO       Or   LORazepam (ATIVAN) 2 MG/ML concentrated solution 1 mg  1 mg Sublingual Q4H PRN Cox, Amy N, DO       Or   LORazepam (ATIVAN) injection 1 mg  1 mg Intravenous Q4H PRN Cox, Amy N, DO   1 mg at 08/27/21 1508   LORazepam (ATIVAN) injection 1 mg  1 mg Intravenous Q4H PRN Cox, Amy N, DO       mirtazapine (REMERON) tablet 7.5 mg  7.5 mg Oral QPM Cox, Amy N, DO   7.5 mg at 08/25/2021 2227   morphine 100mg  in NS 167mL (1mg /mL) infusion - premix  1 mg/hr Intravenous Continuous Sharen Hones, MD 1 mL/hr at 08/27/21 1403 1 mg/hr at 08/27/21 1403   morphine  bolus via infusion 1 mg  1 mg Intravenous Q15 min PRN Cox, Amy N, DO   1 mg at 08/27/21 1619   morphine CONCENTRATE 10 MG/0.5ML oral solution 5 mg  5 mg Oral Q2H PRN Cox, Amy N, DO       Or   morphine CONCENTRATE 10 MG/0.5ML oral solution 5 mg  5 mg Sublingual Q2H PRN Cox, Amy N, DO   5 mg at 08/27/21 1517   octreotide (SANDOSTATIN) injection 100 mcg  100 mcg Subcutaneous Q8H PRN Cox, Amy N, DO       OLANZapine (ZYPREXA) tablet 2.5 mg  2.5 mg Oral QHS Cox, Amy N, DO   2.5 mg at 08/10/2021 2227   ondansetron (ZOFRAN-ODT) disintegrating tablet 4 mg  4 mg Oral Q6H PRN Cox, Amy N, DO       Or   ondansetron (ZOFRAN) injection 4 mg  4 mg Intravenous Q6H PRN Cox, Amy N, DO       oxybutynin (DITROPAN) tablet 2.5 mg  2.5 mg Oral QID PRN Cox, Amy N, DO       polyvinyl alcohol (LIQUIFILM TEARS) 1.4 % ophthalmic solution 1 drop  1 drop Both Eyes QID PRN Cox, Amy N, DO       senna-docusate (Senokot-S) tablet 1 tablet  1 tablet Oral QHS PRN Cox, Amy N, DO       thyroid (ARMOUR) tablet 90 mg  90 mg Oral QODAY Cox, Amy N, DO       traZODone (DESYREL) tablet 25 mg  25 mg Oral QHS PRN Cox, Amy N, DO       vortioxetine HBr (TRINTELLIX) tablet 5 mg  5 mg Oral q morning Cox, Amy N, DO   5 mg at 08/27/2021 2226    OBJECTIVE: Vitals:   08/18/2021 2129 08/27/21 1517  BP: 100/67   Pulse: 94   Resp: 18   Temp: 98.8 F (37.1 C) (!) 101.3 F (38.5 C)  SpO2: 94%      Body mass index is 17.96 kg/m.    ECOG FS:4 - Bedbound  General: Cachectic, no acute distress. Eyes:  Pink conjunctiva, anicteric sclera. HEENT: Normocephalic, moist mucous membranes. Lungs: No audible wheezing or coughing. Heart: Regular rate and rhythm. Abdomen: Soft, nontender, no obvious distention. Musculoskeletal: No edema, cyanosis, or clubbing. Neuro: Lethargic. Cranial nerves grossly intact. Skin: No rashes or petechiae noted.  LAB RESULTS:  Lab Results  Component Value Date   NA 136 09/03/2021   K 3.3 (L) 08/13/2021   CL 98  09/03/2021   CO2 25 08/31/2021   GLUCOSE 128 (H) 08/18/2021   BUN 12 08/29/2021   CREATININE 0.47 08/19/2021   CALCIUM 8.9 08/20/2021   PROT 7.2 08/15/2021   ALBUMIN 3.1 (L) 08/25/2021   AST 34 08/20/2021   ALT 28 08/25/2021   ALKPHOS 179 (H) 08/13/2021   BILITOT 0.7 08/23/2021   GFRNONAA >60 08/10/2021    Lab Results  Component Value Date   WBC 25.2 (H) 08/24/2021   NEUTROABS 22.6 (H) 08/22/2021   HGB 8.3 (L) 08/17/2021   HCT 28.0 (L) 08/14/2021   MCV 87.5 08/23/2021   PLT 339 08/17/2021     STUDIES: DG Hip Unilat W or Wo Pelvis 2-3 Views Left  Result Date: 08/17/2021 CLINICAL DATA:  Left hip and pelvic pain. EXAM: DG HIP (WITH OR WITHOUT PELVIS) 2-3V LEFT COMPARISON:  None Available. FINDINGS: A fracture deformity of indeterminate age is seen extending through the neck of the proximal left femur. There is no evidence of dislocation. Moderate to marked severity degenerative changes are seen in the form of joint space narrowing and acetabular sclerosis. IMPRESSION: Fracture of the proximal left femur of indeterminate age. While this may be chronic in nature, correlation with physical examination is recommended to determine the presence of point tenderness. Additional evaluation with left hip CT is recommended if an acute fracture remains of clinical concern. Electronically Signed   By: Virgina Norfolk M.D.   On: 09/06/2021 18:14   DG Chest 1 View  Result Date: 08/10/2021 CLINICAL DATA:  Lung cancer EXAM: CHEST  1 VIEW COMPARISON:  No recent prior imaging FINDINGS: Left pleural effusion with adjacent atelectasis. Interstitial changes bilaterally. Nonspecific perihilar changes. Cardiomegaly. Right chest wall port with catheter tip overlying the cavoatrial junction. IMPRESSION: No recent prior imaging available. Left pleural effusion with adjacent atelectasis. Nonspecific perihilar changes that could be related to radiation. Nonspecific interstitial changes that may be chronic or  reflect edema or pneumonia. Electronically Signed   By: Macy Mis M.D.   On: 08/31/2021 17:06    ASSESSMENT: Stage IV small cell lung carcinoma.  PLAN:    1.  Stage IV small cell lung carcinoma: Patient previously underwent multiple rounds of chemotherapy at [facility] most recently with weekly paclitaxel in February 2023.  She has not had any treatment since May 07, 2021.  In person evaluation at [facility] on July 02, 2021, her primary oncologist recommended hospice and comfort care.  Given patient's significantly reduced performance status as well as her stated healthcare wishes upon arrival to the emergency room, agree with hospice and comfort care measures.  No further treatment is recommended.  Patient also appears appropriate for hospice home placement.  Appreciate consult.  Lloyd Huger, MD   08/27/2021 4:39 PM

## 2021-08-27 NOTE — Progress Notes (Signed)
Patient with labored breathing, using accessory muscles. Bolus 1mg  morphine per order given. Will continue to monitor.

## 2021-08-27 NOTE — Progress Notes (Signed)
PHARMACY - PHYSICIAN COMMUNICATION CRITICAL VALUE ALERT - BLOOD CULTURE IDENTIFICATION (BCID)  Allison Norris is an 71 y.o. female who presented to [facility] on 08/19/2021 with a chief complaint of weakness, low appetite   Assessment:  1/4(aerobic) bottle staph epidermidis   Name of physician (or Provider) Contacted: Zhang  Current antibiotics: none  Changes to prescribed antibiotics recommended:  Patient is on comfort care measures only, will defer starting antibiotics.  Results for orders placed or performed during the hospital encounter of 08/09/2021  Blood Culture ID Panel (Reflexed) (Collected: 08/29/2021  5:31 PM)  Result Value Ref Range   Enterococcus faecalis NOT DETECTED NOT DETECTED   Enterococcus Faecium NOT DETECTED NOT DETECTED   Listeria monocytogenes NOT DETECTED NOT DETECTED   Staphylococcus species DETECTED (A) NOT DETECTED   Staphylococcus aureus (BCID) NOT DETECTED NOT DETECTED   Staphylococcus epidermidis DETECTED (A) NOT DETECTED   Staphylococcus lugdunensis NOT DETECTED NOT DETECTED   Streptococcus species NOT DETECTED NOT DETECTED   Streptococcus agalactiae NOT DETECTED NOT DETECTED   Streptococcus pneumoniae NOT DETECTED NOT DETECTED   Streptococcus pyogenes NOT DETECTED NOT DETECTED   A.calcoaceticus-baumannii NOT DETECTED NOT DETECTED   Bacteroides fragilis NOT DETECTED NOT DETECTED   Enterobacterales NOT DETECTED NOT DETECTED   Enterobacter cloacae complex NOT DETECTED NOT DETECTED   Escherichia coli NOT DETECTED NOT DETECTED   Klebsiella aerogenes NOT DETECTED NOT DETECTED   Klebsiella oxytoca NOT DETECTED NOT DETECTED   Klebsiella pneumoniae NOT DETECTED NOT DETECTED   Proteus species NOT DETECTED NOT DETECTED   Salmonella species NOT DETECTED NOT DETECTED   Serratia marcescens NOT DETECTED NOT DETECTED   Haemophilus influenzae NOT DETECTED NOT DETECTED   Neisseria meningitidis NOT DETECTED NOT DETECTED   Pseudomonas aeruginosa NOT DETECTED NOT  DETECTED   Stenotrophomonas maltophilia NOT DETECTED NOT DETECTED   Candida albicans NOT DETECTED NOT DETECTED   Candida auris NOT DETECTED NOT DETECTED   Candida glabrata NOT DETECTED NOT DETECTED   Candida krusei NOT DETECTED NOT DETECTED   Candida parapsilosis NOT DETECTED NOT DETECTED   Candida tropicalis NOT DETECTED NOT DETECTED   Cryptococcus neoformans/gattii NOT DETECTED NOT DETECTED   Methicillin resistance mecA/C NOT DETECTED NOT DETECTED    Tashona Calk A Stefannie Defeo 08/27/2021  2:46 PM

## 2021-08-27 NOTE — Progress Notes (Signed)
                                                     Palliative Care Progress Note   Patient Name: Allison Norris       Date: 08/27/2021 DOB: 24-Apr-1950  Age: 71 y.o. MRN#: [id number] Attending Physician: Sharen Hones, MD Primary Care Physician: Pcp, No Admit Date: 09/05/2021  Consult received and acknowledged for Ms. Clayborn Bigness.  After reviewing the patient's chart, I assessed the patient at bedside.  Patient is unable to participate in goals of care discussion at this time.  As per Dr. Roosevelt Locks, goals are clear - comfort measures only- and plan is set - pt to be evaluated for inpatient hospice.  No further PMT needs at this time. Please re-engage PMT if goals change or at patient/family's request.   Thank you for allowing the Palliative Medicine Team to assist in the care of Cleone Slim.  [place] Ilsa Iha, FNP-BC Palliative Medicine Team Team Phone # [phone]  NO CHARGE

## 2021-08-27 NOTE — Progress Notes (Signed)
Nutrition Brief Note  Chart reviewed. Pt now transitioning to comfort care.  No further nutrition interventions planned at this time.  Please re-consult as needed.   Antanisha Mohs W, RD, LDN, CDCES Registered Dietitian II Certified Diabetes Care and Education Specialist Please refer to [facility] for RD and/or RD on-call/weekend/after hours pager   

## 2021-08-27 NOTE — Progress Notes (Signed)
Morphine gtt restarted per order with Montine Circle, RN as witness
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
## 2021-08-27 NOTE — Progress Notes (Signed)
Morphine drip discontinued per Dr. Delories Heinz request.

## 2021-08-27 NOTE — Progress Notes (Signed)
Patient with labored breathing and accessory muscle use. PRN morphine bolus given per order.

## 2021-08-27 NOTE — Progress Notes (Signed)
14 french foley cath placed for comfort. Milky, purulent discharge noted to vagina and ureteral opening. Urine return noted to be cloudy. Patient tolerated moderately well. Did need some prn anxiety medication for agitation/restlessness.
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
## 2021-08-28 DIAGNOSIS — S72002A Fracture of unspecified part of neck of left femur, initial encounter for closed fracture: Secondary | ICD-10-CM | POA: Diagnosis not present

## 2021-08-28 DIAGNOSIS — C3490 Malignant neoplasm of unspecified part of unspecified bronchus or lung: Secondary | ICD-10-CM | POA: Diagnosis not present

## 2021-08-28 DIAGNOSIS — Z515 Encounter for palliative care: Secondary | ICD-10-CM | POA: Diagnosis not present

## 2021-08-29 ENCOUNTER — Telehealth: Payer: Self-pay | Admitting: Emergency Medicine

## 2021-08-29 LAB — CULTURE, BLOOD (ROUTINE X 2): Special Requests: ADEQUATE

## 2021-08-29 NOTE — Telephone Encounter (Signed)
Called husband Luanna Salk [phone].  To a follow conversation that occurred early on this day with his daughter April Harris.  This Probation officer reached out Mr.Georgi to gain insight from his lens that was described earlier through Ms. Harris.  The purpose was to connect directly with him to hear his side of what it felt like from the husbands view. This Probation officer listened to Mr. Bonnette concerns as he gave specific history and events of this current visit. Along with the specific past medical treatments that the patient had been through. This Probation officer was able to verbalize our deepest condolences to Mr. Keiffer with his wife's passing.  This Probation officer did encourage reaching out to "the office of patient experience". Mr. Goering has already reached out and not heard back at the present time.
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
## 2021-08-31 LAB — CULTURE, BLOOD (ROUTINE X 2)
Culture: NO GROWTH
Special Requests: ADEQUATE

## 2021-09-07 NOTE — Plan of Care (Signed)
Attempted a final time to call Allison Norris at [phone]. LM explaining I was going home. Left the number for the floor and she should ask to speak to Denice Paradise, RN or Estill Bamberg, Therapist, sports.  Tried at [phone] and there was NA.
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
## 2021-09-07 NOTE — Significant Event (Signed)
    OVERNIGHT PROGRESS REPORT  Notified by RN that patient has expired at 0600  Patient was DNR/DNI and comfort care  2 RN verified.  Family was not at bedside, RN is contacting family for notification.   This document was prepared using Dragon voice recognition software and may include unintentional dictation errors.  Neomia Glass DNP, MHA, FNP-BC Nurse Practitioner [facility] Pager [phone]

## 2021-09-07 NOTE — Plan of Care (Signed)
Attempted to call daughter, Cleophas Dunker again.  No answer at [phone].

## 2021-09-07 NOTE — Plan of Care (Signed)
Have rounded on pt hourly this shift.  3 - 1 mg boluses of morphine given.  Pt appears comfortable.  Abdominal breathing but not tachypnic.  Looks like she's transitioned to agonal breathing. Will continue to monitor and keep comfortable.
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
## 2021-09-07 NOTE — Care Management (Signed)
RNCM spoke with DSS/APS case worker Earleen Newport this AM regarding update on patient status, she is deceased.  Ms. Tobie Poet stated that she would be in touch with the family.  Care team made aware that DSS was notified

## 2021-09-07 NOTE — Plan of Care (Addendum)
Pt passed peacefully at 0600; I was present with her.   2 nurse verification Kathaleen Grinder, RN and me.  Notified Neomia Glass, NP and AC.  Attempted to call daughter, Cleophas Dunker at both numbers in chart; LM on one and NA at the other.  Will continue to try to notify.
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
## 2021-09-07 NOTE — Progress Notes (Signed)
Left a voice message on [phone], this is number listed for Allison Norris. Asked her to call [facility], did not give any other information. Then called  [phone], listed as work number. Number stated was not in service.

## 2021-09-07 DEATH — deceased

## 2022-11-01 IMAGING — DX DG CHEST 1V
1 series · 1 of 1 positions shown · non-contrast
Comparison: No recent prior imaging

CLINICAL DATA: Lung cancer

EXAM:
CHEST  1 VIEW

[chest ap]
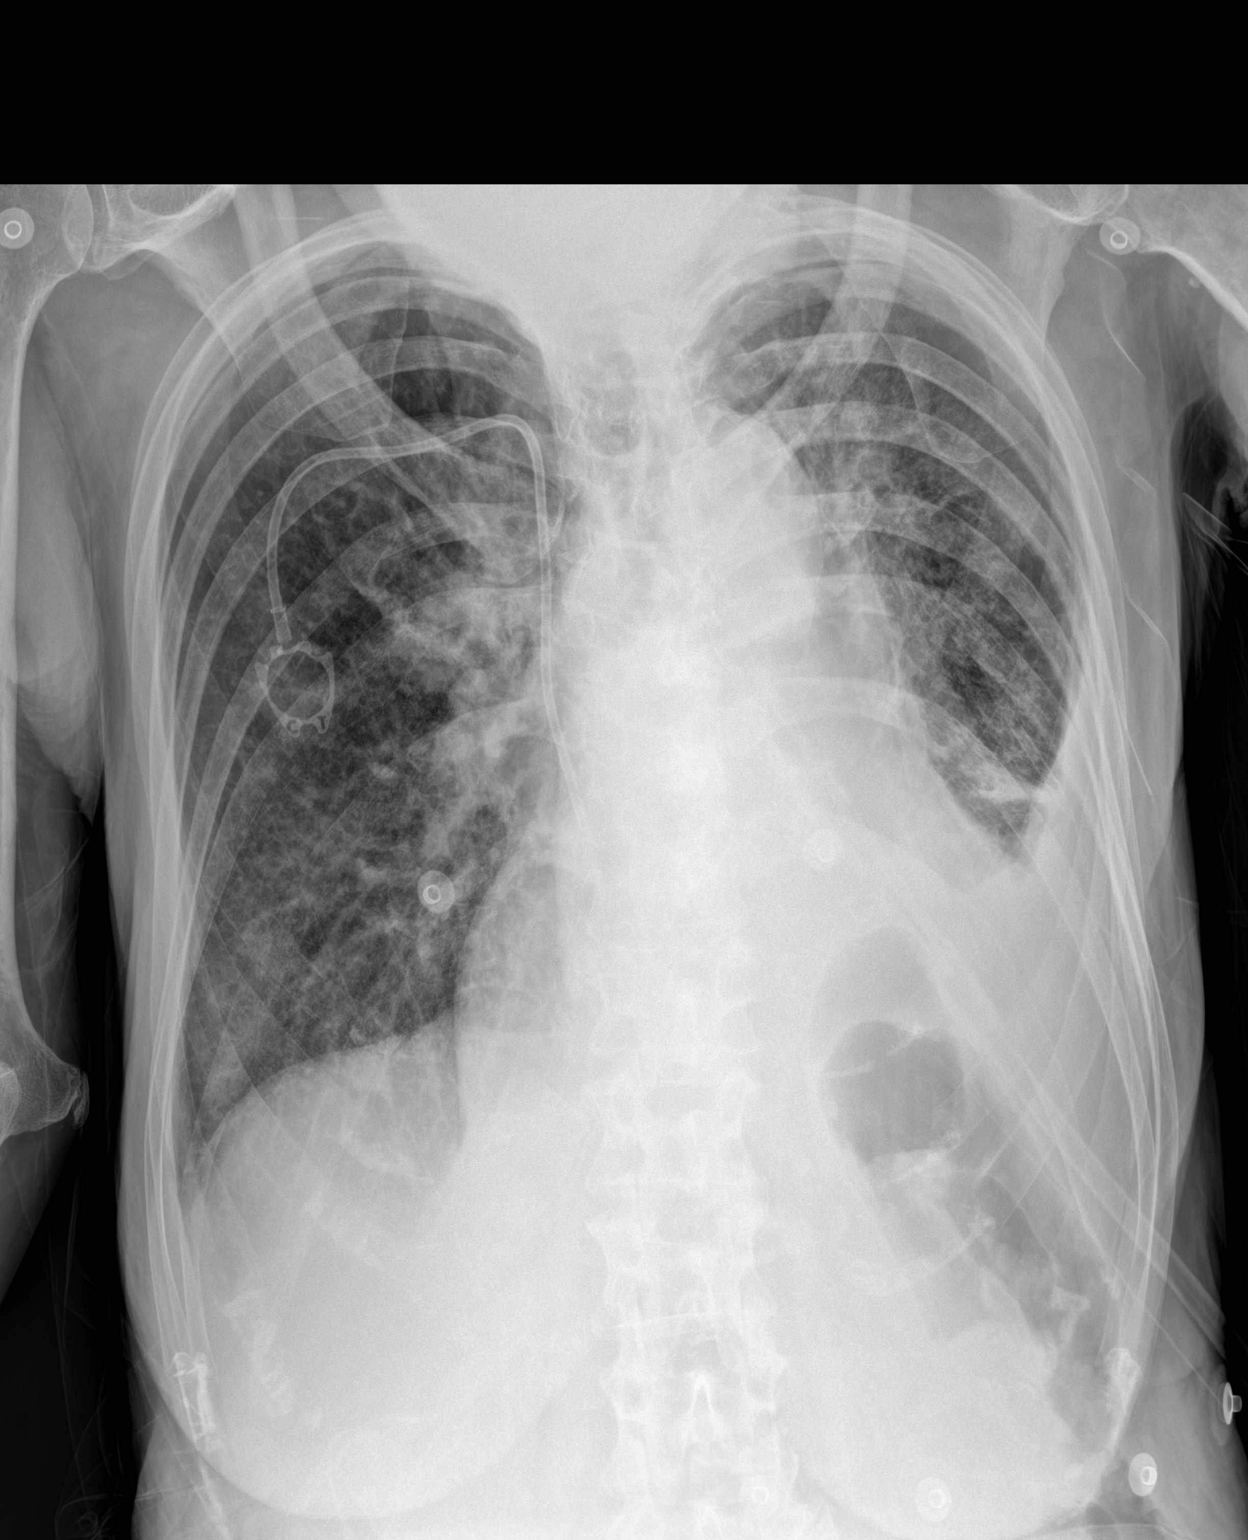

[1 of 1 positions shown; findings below may reference images not displayed]

FINDINGS: Left pleural effusion with adjacent atelectasis. Interstitial
changes bilaterally. Nonspecific perihilar changes. Cardiomegaly.
Right chest wall port with catheter tip overlying the cavoatrial
junction.
IMPRESSION: No recent prior imaging available. Left pleural effusion with
adjacent atelectasis. Nonspecific perihilar changes that could be
related to radiation. Nonspecific interstitial changes that may be
chronic or reflect edema or pneumonia.
